# Patient Record
Sex: Male | Born: 1980 | Race: White | Hispanic: No | Marital: Married
Health system: Southern US, Community
[De-identification: ages and names within clinical notes are randomized; demographics above are authoritative.]

---

## 2012-12-17 ENCOUNTER — Other Ambulatory Visit: Payer: Self-pay | Admitting: Chiropractic Medicine

## 2012-12-17 ENCOUNTER — Ambulatory Visit
Admission: RE | Admit: 2012-12-17 | Discharge: 2012-12-17 | Disposition: A | Discharge status: Home / Self Care | Payer: Managed Care, Other (non HMO) | Source: Ambulatory Visit | Attending: Chiropractic Medicine | Admitting: Chiropractic Medicine

## 2012-12-17 DIAGNOSIS — R52 Pain, unspecified: Secondary | ICD-10-CM

## 2015-01-15 IMAGING — CR DG CERVICAL SPINE WITH FLEX & EXTEND
7 series · 7 of 7 positions shown · non-contrast
Comparison: None.

CLINICAL DATA: Neck pain after lifting.  Right arm pain.

CERVICAL SPINE COMPLETE WITH FLEXION AND EXTENSION VIEWS

[view not recorded (1 of 7)]
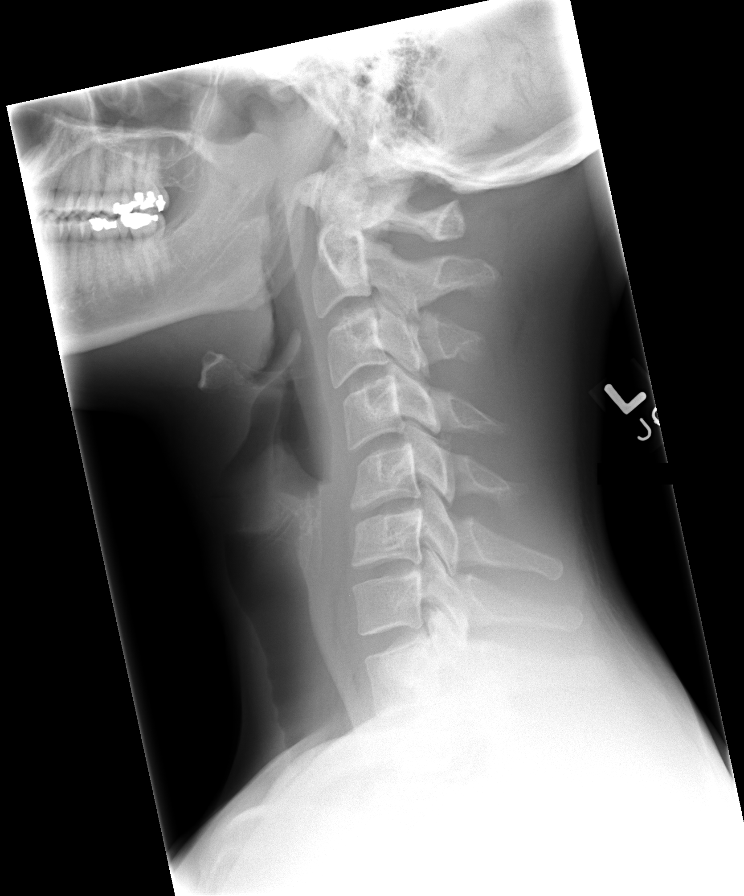

[view not recorded (2 of 7)]
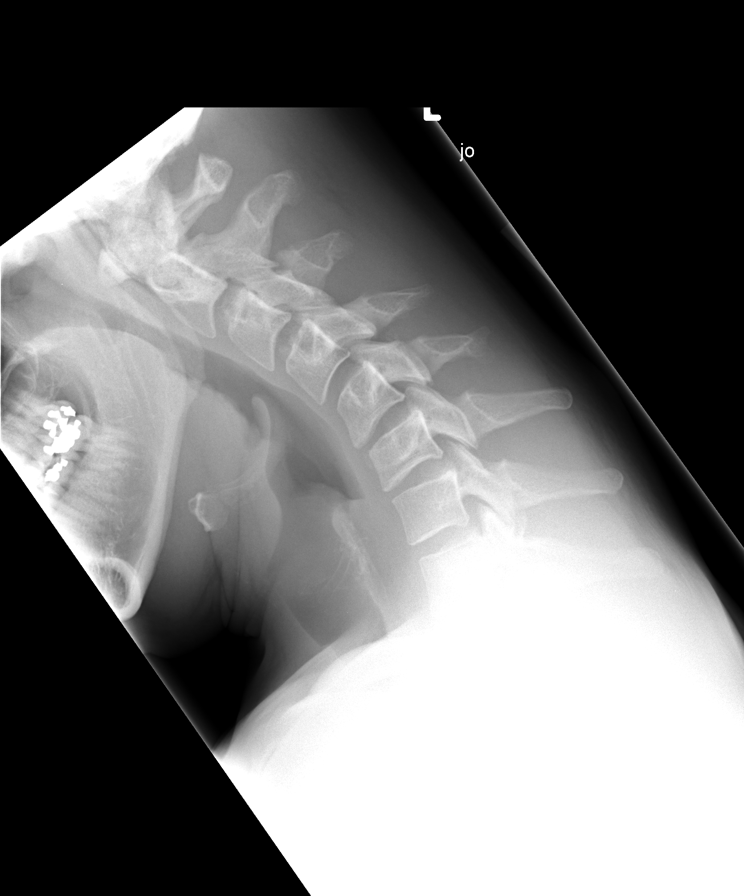

[view not recorded (3 of 7)]
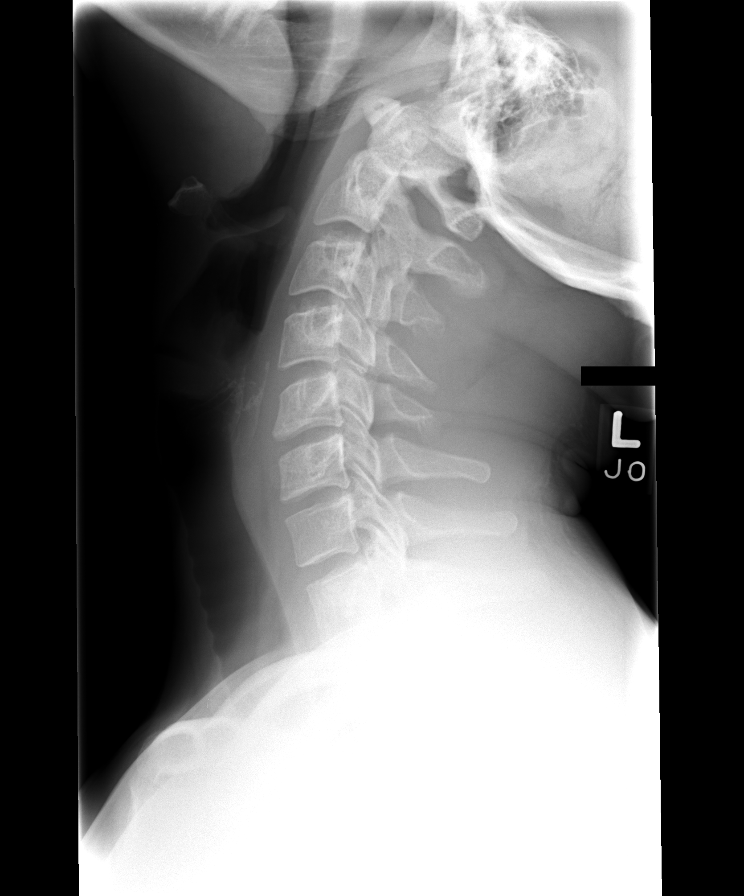

[view not recorded (4 of 7)]
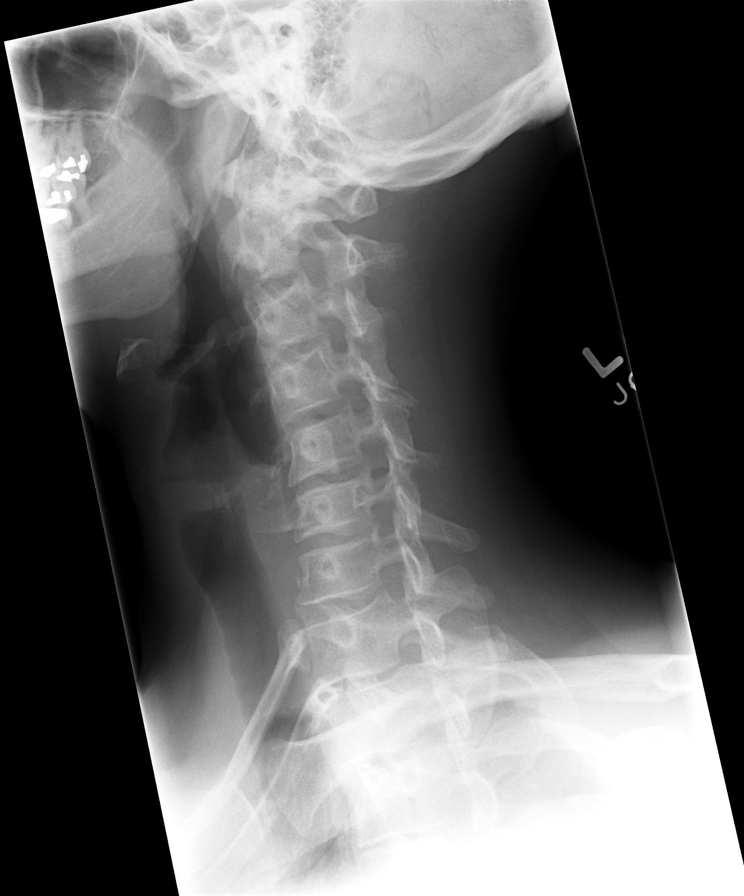

[view not recorded (5 of 7)]
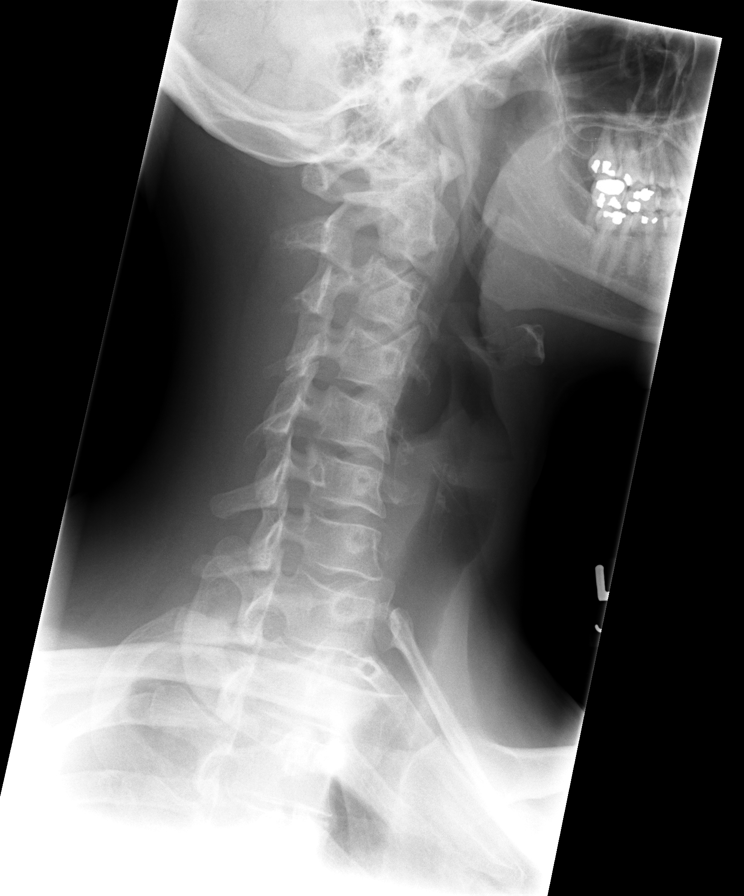

[view not recorded (6 of 7)]
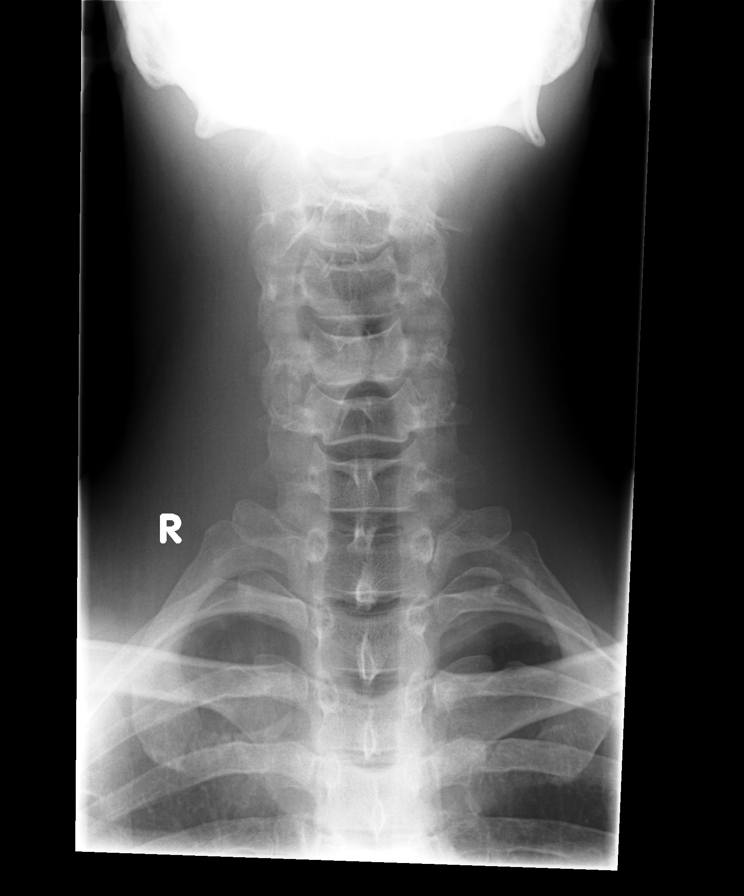

[view not recorded (7 of 7)]
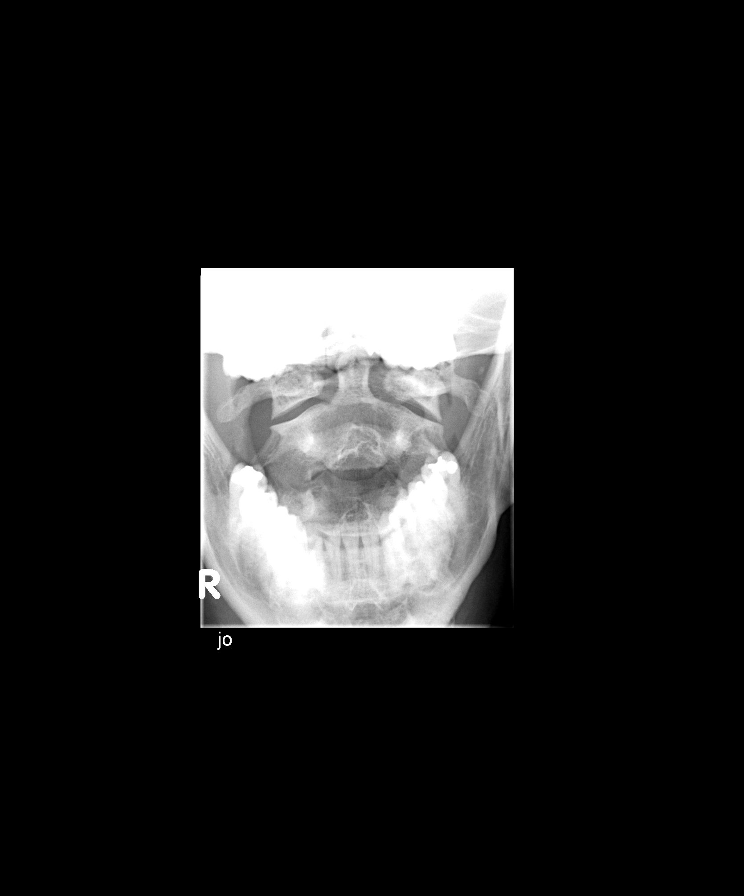

[7 of 7 positions shown; findings below may reference images not displayed]

FINDINGS: Reversal of the normal cervical lordotic curve.  Disc
space narrowing C5-C6.  No dynamic instability.  Mild right-sided
foraminal narrowing at C5-C6 related to uncinate spurring.  No
other significant foraminal narrowing.  Lung apices are clear.
Negative odontoid.
IMPRESSION: Reversal of the normal cervical lordotic curve without evidence for
acute injury.

No dynamic instability.

Asymmetric uncinate spurring at C5-6 on the right could affect the
right C6 nerve root.

## 2022-05-14 ENCOUNTER — Encounter: Payer: Self-pay | Admitting: Urology

## 2022-05-14 ENCOUNTER — Ambulatory Visit (INDEPENDENT_AMBULATORY_CARE_PROVIDER_SITE_OTHER): Payer: BC Managed Care – PPO | Admitting: Urology

## 2022-05-14 VITALS — BP 130/78 | HR 82 | Ht 73.0 in | Wt 178.0 lb

## 2022-05-14 DIAGNOSIS — Z3009 Encounter for other general counseling and advice on contraception: Secondary | ICD-10-CM

## 2022-05-14 MED ORDER — DIAZEPAM 10 MG PO TABS: ORAL_TABLET | ORAL | 0 refills | Status: AC

## 2022-05-14 NOTE — Progress Notes (Signed)
05/14/2022 8:55 AM   Carie Caddy [birth year] [id number]  Referring provider: No referring provider defined for this encounter.  Chief Complaint  Patient presents with   VAS Consult    HPI: Henry Fischer is a 41 y.o. male who presents for vasectomy counseling.  Married with 3 children History orchitis ~ 10 years ago but no recurrence or chronic scrotal pain No previous history inguinal hernia or pelvic surgery No history of bleeding or clotting disorders   PMH: No past medical history on file.  Surgical History: None  Home Medications:  Allergies as of 05/14/2022   No Known Allergies      Medication List        Accurate as of May 14, 2022  8:55 AM. If you have any questions, ask your nurse or doctor.          Clomid 50 MG tablet Generic drug: clomiPHENE Take 25 mg by mouth daily.        Allergies: No Known Allergies  Family History: No family history on file.  Social History:  has no history on file for tobacco use, alcohol use, and drug use.   Physical Exam: BP 130/78   Pulse 82   Ht 6\' 1"  (1.854 m)   Wt 178 lb (80.7 kg)   BMI 23.48 kg/m   Constitutional:  Alert and oriented, No acute distress. HEENT: [place] AT Cardiovascular: No clubbing, cyanosis, or edema. Respiratory: Normal respiratory effort, no increased work of breathing. GU: Phallus without lesions, testes descended bilaterally without masses or tenderness, spermatic cord/epididymis palpably normal bilaterally.  Vasa easily palpable bilaterally Psychiatric: Normal mood and affect.   Assessment & Plan:    1.  Undesired fertility Desires to schedule vasectomy We had a long discussion about vasectomy. We specifically discussed the procedure, recovery and the risks, benefits and alternatives of vasectomy. I explained that the procedure entails removal of a segment of each vas deferens, each of which conducts sperm, and that the purpose of this procedure is to cause sterility  (inability to produce children or cause pregnancy). Vasectomy is intended to be permanent and irreversible form of contraception. Options for fertility after vasectomy include vasectomy reversal, or sperm retrieval with in vitro fertilization. These options are not always successful, and they may be expensive. We discussed reversible forms of birth control such as condoms, IUD or diaphragms, as well as the option of freezing sperm in a sperm bank prior to the vasectomy procedure. We discussed the importance of avoiding strenuous exercise for four days after vasectomy, and the importance of refraining from any form of ejaculation for seven days after vasectomy. I explained that vasectomy does not produce immediate sterility so another form of contraceptive must be used until sterility is assured by having semen checked for sperm. Thus, a post vasectomy semen analysis is necessary to confirm sterility. Rarely, vasectomy must be repeated. We discussed the approximately 1 in 2,000 risk of pregnancy after vasectomy for men who have post-vasectomy semen analysis showing absent sperm or rare non-motile sperm. Typical side effects include a small amount of oozing blood, some discomfort and mild swelling in the area of incision.  Vasectomy does not affect sexual performance, function, please, sensation, interest, desire, satisfaction, penile erection, volume of semen or ejaculation. Other rare risks include allergy or adverse reaction to an anesthetic, testicular atrophy, hematoma, infection/abscess, prolonged tenderness of the vas deferens, pain, swelling, painful nodule or scar (called sperm granuloma) or epididymtis. We discussed chronic testicular pain syndrome. This has been  reported to occur in as many as 1-2% of men and may be permanent. This can be treated with medication, small procedures or (rarely) surgery. Valium 10 mg as a preprocedure anxiolytic sent to pharmacy and he was informed he would need a driver if  utilizing this medication    Riki Altes, MD  [facility] [address] [place], [place] [address] [phone]

## 2022-05-14 NOTE — Patient Instructions (Signed)

## 2022-06-22 ENCOUNTER — Encounter: Payer: Self-pay | Admitting: Urology

## 2022-06-22 ENCOUNTER — Ambulatory Visit (INDEPENDENT_AMBULATORY_CARE_PROVIDER_SITE_OTHER): Payer: BC Managed Care – PPO | Admitting: Urology

## 2022-06-22 VITALS — BP 150/83 | HR 101 | Ht 73.0 in | Wt 175.0 lb

## 2022-06-22 DIAGNOSIS — Z302 Encounter for sterilization: Secondary | ICD-10-CM | POA: Diagnosis not present

## 2022-06-22 MED ORDER — HYDROCODONE-ACETAMINOPHEN 5-325 MG PO TABS: 1.0000 | ORAL_TABLET | Freq: Four times a day (QID) | ORAL | 0 refills | Status: AC | PRN

## 2022-06-22 NOTE — Patient Instructions (Signed)

## 2022-06-22 NOTE — Progress Notes (Unsigned)
   Vasectomy Procedure Note  Indications: Henry Fischer is a 42 y.o. male who presents today for elective sterilization.  He has been consented for the procedure.  He is aware of the risks and benefits.  He had no additional questions.  He agrees to proceed.  He denies any other significant change since his last visit.  Pre-operative Diagnosis: Elective sterilization  Post-operative Diagnosis: Elective sterilization  Premedication: Valium 10 mg po  Surgeon: Nicki Reaper C. Roxan Yamamoto, M.D  Description: The patient was prepped and draped in the standard fashion.  The right vas deferens was identified and brought superiorly to the anterior scrotal skin.  The skin and vas were then anesthetized utilizing 5 ml 1% lidocaine.  A small stab incision was made and spread with the vas dissector.  The vas was grasped utilizing the vas clamp and elevated out of the incision.  The vas was dissected free from surrounding tissue and vessels and an ~1 cm segment was excised.  The vas lumens were cauterized utilizing electrocautery.  The distal segment was buried in the surrounding sheath with a 3-0 chromic suture.  No significant bleeding was observed.  The vas ends were then dropped back into the hemiscrotum.  The skin was closed with hemostatic pressure.  An identical procedure was performed on the contralateral side.  Clean dry gauze was applied to the incision sites.  The patient tolerated the procedure well.  Complications:None  Recommendations: 1.  No lifting greater than 10 pounds or strenuous activity for 1 week. 2.  Scrotal support for 1-2 weeks. 3.  May shower in 24 hours; no bath, hot tub for 1 week 4.  No intercourse for at least 7 days and resume based on level of discomfort  5.  Continue alternate contraception for 12 weeks.  6.  Call for significant pain, swelling, redness, drainage or fever greater than 100.5. 7.  Rx hydrocodone/APAP 5/325 1-2 every 6 hours prn pain. 8.  Follow-up semen analysis  in 12 weeks.   [name], MD

## 2022-09-12 ENCOUNTER — Other Ambulatory Visit: Payer: Self-pay

## 2022-09-12 DIAGNOSIS — Z302 Encounter for sterilization: Secondary | ICD-10-CM

## 2022-09-13 ENCOUNTER — Other Ambulatory Visit: Payer: BC Managed Care – PPO

## 2022-09-13 ENCOUNTER — Other Ambulatory Visit: Payer: Self-pay

## 2022-09-13 DIAGNOSIS — Z302 Encounter for sterilization: Secondary | ICD-10-CM

## 2022-09-15 LAB — POST-VAS SPERM EVALUATION,QUAL: Volume: 3 mL

## 2022-09-17 ENCOUNTER — Telehealth: Payer: Self-pay | Admitting: *Deleted

## 2022-09-17 NOTE — Telephone Encounter (Signed)
Notified patient as instructed, patient pleased °

## 2022-09-17 NOTE — Telephone Encounter (Signed)
-----   Message from Abbie Sons, MD sent at 09/17/2022  7:31 AM EDT ----- Semen analysis showed no sperm present.  Okay to use vasectomy as primary contraception.

## 2022-09-21 ENCOUNTER — Other Ambulatory Visit: Payer: BC Managed Care – PPO
